# Patient Record
Sex: Male | Born: 1965 | Race: White | Hispanic: No | Marital: Married | Smoking: Current every day smoker
Health system: Southern US, Community
[De-identification: ages and names within clinical notes are randomized; demographics above are authoritative.]

## PROBLEM LIST (undated history)

## (undated) DIAGNOSIS — R059 Cough, unspecified: Secondary | ICD-10-CM

## (undated) DIAGNOSIS — R7303 Prediabetes: Principal | ICD-10-CM

## (undated) DIAGNOSIS — R05 Cough: Secondary | ICD-10-CM

## (undated) HISTORY — PX: NO PAST SURGERIES: SHX2092

## (undated) HISTORY — DX: Cough: R05

## (undated) HISTORY — DX: Prediabetes: R73.03

## (undated) HISTORY — DX: Cough, unspecified: R05.9

---

## 2012-03-07 ENCOUNTER — Encounter: Payer: Self-pay | Admitting: Internal Medicine

## 2012-05-08 ENCOUNTER — Ambulatory Visit (INDEPENDENT_AMBULATORY_CARE_PROVIDER_SITE_OTHER)
Admission: RE | Admit: 2012-05-08 | Discharge: 2012-05-08 | Disposition: A | Discharge status: Home / Self Care | Payer: 59 | Source: Ambulatory Visit | Attending: Internal Medicine | Admitting: Internal Medicine

## 2012-05-08 ENCOUNTER — Encounter: Payer: Self-pay | Admitting: Internal Medicine

## 2012-05-08 ENCOUNTER — Ambulatory Visit (INDEPENDENT_AMBULATORY_CARE_PROVIDER_SITE_OTHER): Payer: 59 | Admitting: Internal Medicine

## 2012-05-08 VITALS — BP 122/84 | HR 75 | Temp 97.8°F | Ht 69.5 in | Wt 194.0 lb

## 2012-05-08 DIAGNOSIS — R05 Cough: Secondary | ICD-10-CM

## 2012-05-08 NOTE — Patient Instructions (Addendum)
Dulera 2 puffs at night until sample gone Next visit in 4-5 weeks, please schedule (physical, fasting) --- Please get your x-ray at the other [facility]  office located at: [address] [place], across from [facility].  Please go to the basement, this is a walk-in facility, they are open from 8:30 to 5:30 PM. Phone number [phone].

## 2012-05-08 NOTE — Progress Notes (Signed)
  Subjective:    Patient ID: Kent Vargas, male    DOB: Oct 12, 1965, 47 y.o.   MRN: [id number]  HPI New patient , here to get established. He reports a five-month history of cough, on and off throughout the day, cough is dry. He smoked one pack a day since age 85, he quit 2 weeks ago through a smoking cessation program.  Past Medical History  Diagnosis Date  . Cough    Past Surgical History  Procedure Laterality Date  . No past surgeries     History   Social History  . Marital Status: Married    Spouse Name: N/A    Number of Children: 1  . Years of Education: N/A   Occupational History  . Tree surgeon    Social History Main Topics  . Smoking status: Former Games developer  . Smokeless tobacco: Never Used     Comment: 1 pp from age 55 to 68  . Alcohol Use: Yes     Comment: rare  . Drug Use: Not on file  . Sexually Active: Not on file   Other Topics Concern  . Not on file   Social History Narrative  . No narrative on file   Family History  Problem Relation Age of Onset  . Lung cancer Other     aunt  . Colon cancer Neg Hx   . Prostate cancer Neg Hx   . CAD Neg Hx   . Heart block Other     M and B have pacemakers   . Diabetes Neg Hx    Review of Systems Denies fever chills. No weight loss or chest pain. No wheezing although wife told him that some nights she hears some wheezing. He is very active ; no wheezing when physically activity. Denies GERD symptoms Denies any itchy eyes or itchy nose.    Objective:   Physical Exam General -- alert, well-developed, healthy-appearing, no apparent distress  Neck --no thyromegaly , no LADs or supraclavicular mass. HEENT-- TM R normal, L wax. Throat unremarkable  Lungs -- normal respiratory effort, no intercostal retractions, no accessory muscle use, and normal breath sounds.   Heart-- normal rate, regular rhythm, no murmur, and no gallop.   Extremities-- no pretibial edema bilaterally  Neurologic-- alert &  oriented X3 and strength normal in all extremities. Psych-- Cognition and judgment appear intact. Alert and cooperative with normal attention span and concentration.  not anxious appearing and not depressed appearing.      Assessment & Plan:  Cerumen impaction-- s.p lavage, TM looks normal Smoker-- praised for quitting

## 2012-05-08 NOTE — Assessment & Plan Note (Signed)
New patient, heavy smoker with 5 month history of dry cough, quit tobacco 2 weeks ago. Clinically, no postnasal dripping or GERD. Wheezing at night? Plan: Chest x-ray Sample of dulera 2 puffs qhs until sample gone RTC 4-5 weeks for CPX, will reassess cough then

## 2012-05-09 ENCOUNTER — Encounter: Payer: Self-pay | Admitting: Internal Medicine

## 2012-06-12 ENCOUNTER — Encounter: Payer: Self-pay | Admitting: Internal Medicine

## 2012-06-12 ENCOUNTER — Ambulatory Visit (INDEPENDENT_AMBULATORY_CARE_PROVIDER_SITE_OTHER): Payer: 59 | Admitting: Internal Medicine

## 2012-06-12 VITALS — BP 122/84 | HR 92 | Ht 68.0 in | Wt 189.0 lb

## 2012-06-12 DIAGNOSIS — Z0000 Encounter for general adult medical examination without abnormal findings: Secondary | ICD-10-CM

## 2012-06-12 DIAGNOSIS — Z23 Encounter for immunization: Secondary | ICD-10-CM

## 2012-06-12 DIAGNOSIS — R05 Cough: Secondary | ICD-10-CM

## 2012-06-12 LAB — COMPREHENSIVE METABOLIC PANEL
AST: 21 U/L (ref 0–37)
Albumin: 4 g/dL (ref 3.5–5.2)
Alkaline Phosphatase: 91 U/L (ref 39–117)
BUN: 12 mg/dL (ref 6–23)
Potassium: 4.2 mEq/L (ref 3.5–5.1)
Sodium: 137 mEq/L (ref 135–145)
Total Bilirubin: 0.5 mg/dL (ref 0.3–1.2)
Total Protein: 7.7 g/dL (ref 6.0–8.3)

## 2012-06-12 LAB — LDL CHOLESTEROL, DIRECT: Direct LDL: 157.8 mg/dL

## 2012-06-12 LAB — CBC WITH DIFFERENTIAL/PLATELET
Basophils Relative: 0.4 % (ref 0.0–3.0)
Eosinophils Absolute: 0.5 10*3/uL (ref 0.0–0.7)
Eosinophils Relative: 4.3 % (ref 0.0–5.0)
HCT: 42.7 % (ref 39.0–52.0)
Lymphs Abs: 2.2 10*3/uL (ref 0.7–4.0)
MCHC: 34.1 g/dL (ref 30.0–36.0)
MCV: 85 fl (ref 78.0–100.0)
Monocytes Absolute: 1.2 10*3/uL — ABNORMAL HIGH (ref 0.1–1.0)
Neutro Abs: 7.1 10*3/uL (ref 1.4–7.7)
Neutrophils Relative %: 64.6 % (ref 43.0–77.0)
RBC: 5.02 Mil/uL (ref 4.22–5.81)

## 2012-06-12 LAB — TSH: TSH: 1.4 u[IU]/mL (ref 0.35–5.50)

## 2012-06-12 LAB — LIPID PANEL
Total CHOL/HDL Ratio: 6
Triglycerides: 149 mg/dL (ref 0.0–149.0)

## 2012-06-12 MED ORDER — PREDNISONE 10 MG PO TABS: ORAL_TABLET | ORAL | Status: DC

## 2012-06-12 MED ORDER — DOXYCYCLINE HYCLATE 100 MG PO TABS: 100.0000 mg | ORAL_TABLET | Freq: Two times a day (BID) | ORAL | Status: DC

## 2012-06-12 MED ORDER — HYDROCODONE-HOMATROPINE 5-1.5 MG/5ML PO SYRP: 5.0000 mL | ORAL_SOLUTION | Freq: Every evening | ORAL | Status: DC | PRN

## 2012-06-12 NOTE — Patient Instructions (Signed)
For cough, take Mucinex DM twice a day as needed Claritin OTC 10 mg one every night for 2 or 3 weeks  If you have severe cough at night, take hydrocodone, Will make you drowsy. Prednisone as prescribed for few days Take the antibiotic as prescribed Doxycycline, for 10 days Call if no better in 2 weeks for a referral Call anytime if the symptoms are severe

## 2012-06-12 NOTE — Assessment & Plan Note (Signed)
Since the last time he was here, a chest x-ray was unremarkable, he tried dulera, Unclear if it helped. Atypical Bronchial infection? Sinusitis? Allergies? Plan: Doxycycline Prednisone for a few days Cough suppression with hydrocodone pt will call in 2 weeks if cough persists, will refer to pulmonary.

## 2012-06-12 NOTE — Progress Notes (Signed)
  Subjective:    Patient ID: Kent Vargas, male    DOB: 1965/02/21, 47 y.o.   MRN: [id number]  HPI CPX  Past Medical History  Diagnosis Date  . Cough    Past Surgical History  Procedure Laterality Date  . No past surgeries     History   Social History  . Marital Status: Married    Spouse Name: Matas Burrows    Number of Children: 1  . Years of Education: N/A   Occupational History  . Tree surgeon   .     Social History Main Topics  . Smoking status: Former Games developer  . Smokeless tobacco: Never Used     Comment: 1 ppd from age 34 to 45  . Alcohol Use: Yes     Comment: rare  . Drug Use: Not on file  . Sexually Active: Not on file   Other Topics Concern  . Not on file   Social History Narrative   Diet: healthy on-off   Exercise: little recently, trying to do more, very active at work   Family History  Problem Relation Age of Onset  . Lung cancer Other     aunt  . Colon cancer Neg Hx   . Prostate cancer Neg Hx   . CAD Neg Hx   . Heart block Other     M and B have pacemakers   . Diabetes Neg Hx      Review of Systems Recently seen with cough continue with symptoms, persistent cough, now w/ yellow discharge, unclear to the patient if mucus is from the sinuses or the chest. Denies chest pain. No nausea, vomiting, diarrhea, no GERD symptoms. No dysuria, gross hematuria or difficulty urinating.     Objective:   Physical Exam  Musculoskeletal:       Arms:  BP 122/84  Pulse 92  Ht 5\' 8"  (1.727 m)  Wt 189 lb (85.73 kg)  BMI 28.74 kg/m2  SpO2 97%  General -- alert, well-developed, NAD   Neck --no thyromegaly , normal carotid pulse, no LADs HEENT -- TMs normal, throat w/o redness, face symmetric and not tender to palpation, nose congested, no d/c noted  Lungs -- normal respiratory effort, no intercostal retractions, no accessory muscle use, and normal breath sounds.   Heart-- normal rate, regular rhythm, no murmur, and no gallop.     Abdomen--soft, non-tender, no distention, no masses, no HSM, no guarding, and no rigidity.   Extremities-- no pretibial edema bilaterally  Neurologic-- alert & oriented X3 and strength normal in all extremities. Psych-- Cognition and judgment appear intact. Alert and cooperative with normal attention span and concentration.  not anxious appearing and not depressed appearing.       Assessment & Plan:  Subcutaneous mass: Patient noted this 2 years ago, it stopped growing about a year ago, asymptomatic. We discussed possible surgical referral.

## 2012-06-12 NOTE — Assessment & Plan Note (Signed)
Tdap today Never had a colonoscopy  discussed diet and exercise Labs

## 2012-06-13 ENCOUNTER — Encounter: Payer: Self-pay | Admitting: Internal Medicine

## 2012-06-18 ENCOUNTER — Telehealth: Payer: Self-pay | Admitting: Internal Medicine

## 2012-06-18 DIAGNOSIS — R222 Localized swelling, mass and lump, trunk: Secondary | ICD-10-CM

## 2012-06-18 NOTE — Telephone Encounter (Signed)
Labs; Has a few minor abnormalities (CBC) ---> that needs to be rechecked in one year. LDL 157.8, goal is less than 160, ideally I would like to see < 130. Discussed w/ patient. Also we agreed on surgery referral for SQ mass

## 2012-06-27 ENCOUNTER — Ambulatory Visit (INDEPENDENT_AMBULATORY_CARE_PROVIDER_SITE_OTHER): Payer: Private Health Insurance - Indemnity | Admitting: General Surgery

## 2012-06-27 ENCOUNTER — Encounter (INDEPENDENT_AMBULATORY_CARE_PROVIDER_SITE_OTHER): Payer: Self-pay | Admitting: General Surgery

## 2012-06-27 VITALS — BP 140/74 | HR 84 | Temp 97.3°F | Resp 16 | Ht 68.0 in | Wt 195.0 lb

## 2012-06-27 DIAGNOSIS — D171 Benign lipomatous neoplasm of skin and subcutaneous tissue of trunk: Secondary | ICD-10-CM

## 2012-06-27 DIAGNOSIS — D1779 Benign lipomatous neoplasm of other sites: Secondary | ICD-10-CM

## 2012-06-27 NOTE — Progress Notes (Signed)
Subjective:     Patient ID: Kent Vargas, male   DOB: 05-25-1965, 47 y.o.   MRN: [id number]  HPI We are asked to see the patient in consultation by Dr. Drue Novel to evaluate him for a mass on his back. The patient is a 47 year old white male who fell about a year ago and broke some ribs on his left side. Shortly after this he noticed a bump on his back. He denies any pain. He mostly notices it when he rolls over in bed. He does not feel that it has gotten much larger since he first noticed it.  Review of Systems  Constitutional: Negative.   HENT: Negative.   Eyes: Negative.   Respiratory: Negative.   Cardiovascular: Negative.   Gastrointestinal: Negative.   Endocrine: Negative.   Genitourinary: Negative.   Musculoskeletal: Negative.   Skin: Negative.   Allergic/Immunologic: Negative.   Neurological: Negative.   Hematological: Negative.   Psychiatric/Behavioral: Negative.        Objective:   Physical Exam  Constitutional: He is oriented to person, place, and time. He appears well-developed and well-nourished.  HENT:  Head: Normocephalic and atraumatic.  Eyes: Conjunctivae and EOM are normal. Pupils are equal, round, and reactive to light.  Neck: Normal range of motion. Neck supple.  Cardiovascular: Normal rate, regular rhythm and normal heart sounds.   Pulmonary/Chest: Effort normal and breath sounds normal.  There is a fatty feeling mass on the left lower rib cage posteriorly that is mobile and measures about 5 cm in diameter  Abdominal: Soft. Bowel sounds are normal.  Musculoskeletal: Normal range of motion.  Neurological: He is alert and oriented to person, place, and time.  Skin: Skin is warm and dry.  Psychiatric: He has a normal mood and affect. His behavior is normal.       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 I have offered to remove this for him. Since it does not bother him he would like to watch it  and see what happens.     Plan:     My plan will be to see him back in 6 months at which time if this mass begins to bother him more seems to be enlarging that I think it should be removed. He agrees to call us if anything changes in the meantime

## 2012-06-27 NOTE — Patient Instructions (Signed)
Call if it changes or begins to bother you

## 2012-11-15 ENCOUNTER — Telehealth: Payer: Self-pay | Admitting: *Deleted

## 2012-11-15 NOTE — Telephone Encounter (Signed)
Medical Evaluation Form for [facility] [employer] completed and placed in provider's folder for signature.

## 2012-11-18 ENCOUNTER — Telehealth: Payer: Self-pay | Admitting: Internal Medicine

## 2012-11-18 NOTE — Telephone Encounter (Signed)
Advise patient,  he was seen twice this year with cough,  doing better?, cough resolved? I need to know before I fill his paper work. Let me know

## 2012-11-19 DIAGNOSIS — Z0279 Encounter for issue of other medical certificate: Secondary | ICD-10-CM

## 2012-11-19 NOTE — Telephone Encounter (Signed)
Spoke to pt, states that he is currently doing fine, no complaints. DJR

## 2012-11-19 NOTE — Telephone Encounter (Signed)
thx

## 2012-12-19 ENCOUNTER — Ambulatory Visit (INDEPENDENT_AMBULATORY_CARE_PROVIDER_SITE_OTHER): Payer: Private Health Insurance - Indemnity | Admitting: General Surgery

## 2013-01-01 ENCOUNTER — Encounter (INDEPENDENT_AMBULATORY_CARE_PROVIDER_SITE_OTHER): Payer: Self-pay | Admitting: General Surgery

## 2013-01-01 ENCOUNTER — Ambulatory Visit (INDEPENDENT_AMBULATORY_CARE_PROVIDER_SITE_OTHER): Payer: Private Health Insurance - Indemnity | Admitting: General Surgery

## 2013-01-01 ENCOUNTER — Encounter (INDEPENDENT_AMBULATORY_CARE_PROVIDER_SITE_OTHER): Payer: Self-pay

## 2013-01-01 VITALS — BP 126/82 | HR 72 | Temp 97.7°F | Resp 14 | Ht 67.0 in | Wt 200.0 lb

## 2013-01-01 DIAGNOSIS — D171 Benign lipomatous neoplasm of skin and subcutaneous tissue of trunk: Secondary | ICD-10-CM

## 2013-01-01 DIAGNOSIS — D1779 Benign lipomatous neoplasm of other sites: Secondary | ICD-10-CM

## 2013-01-01 NOTE — Progress Notes (Signed)
Subjective:     Patient ID: Kent Vargas, male   DOB: 05-Dec-1965, 47 y.o.   MRN: [id number]  HPI The patient has a lipoma on his left posterior chest wall. We evaluated him initially about 6 months ago. He returns today for followup. In that time he has had no problems. He denies any pain. This lipoma does not limit his activity in any way. He does not feel it has gotten any larger.  Review of Systems  Constitutional: Negative.   HENT: Negative.   Eyes: Negative.   Respiratory: Negative.   Cardiovascular: Negative.   Gastrointestinal: Negative.   Endocrine: Negative.   Genitourinary: Negative.   Musculoskeletal: Negative.   Skin: Negative.   Allergic/Immunologic: Negative.   Neurological: Negative.   Hematological: Negative.   Psychiatric/Behavioral: Negative.        Objective:   Physical Exam  Constitutional: He is oriented to person, place, and time. He appears well-developed and well-nourished.  HENT:  Head: Normocephalic and atraumatic.  Eyes: Conjunctivae and EOM are normal. Pupils are equal, round, and reactive to light.  Neck: Normal range of motion. Neck supple.  Cardiovascular: Normal rate, regular rhythm and normal heart sounds.   Pulmonary/Chest: Effort normal and breath sounds normal.  There is an approximately 5 cm fatty lipoma of the left posterior chest wall which is unchanged  Abdominal: Soft. Bowel sounds are normal.  Musculoskeletal: Normal range of motion.  Neurological: He is alert and oriented to person, place, and time.  Skin: Skin is warm and dry.  Psychiatric: He has a normal mood and affect. His behavior is normal.       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 At this point the options would be to remove it or leave it alone     Plan:     At this point he does not want to have any surgery. He agrees to call us if it changes or begins to bother him at all.

## 2013-01-01 NOTE — Patient Instructions (Signed)
Call if it changes or bothers you

## 2013-10-23 ENCOUNTER — Telehealth: Payer: Self-pay

## 2013-10-23 NOTE — Telephone Encounter (Signed)
DSS Paperwork brought in by Pts wife, form completed by Dr. Larose Kells, copies made and original given back to Pts wife. Form placed in scanning folder to be scanned.

## 2014-03-16 IMAGING — CR DG CHEST 2V
2 series · 2 of 2 positions shown · non-contrast
Comparison: None.

CLINICAL DATA: Cough

CHEST - 2 VIEW

[view not recorded (1 of 2)]
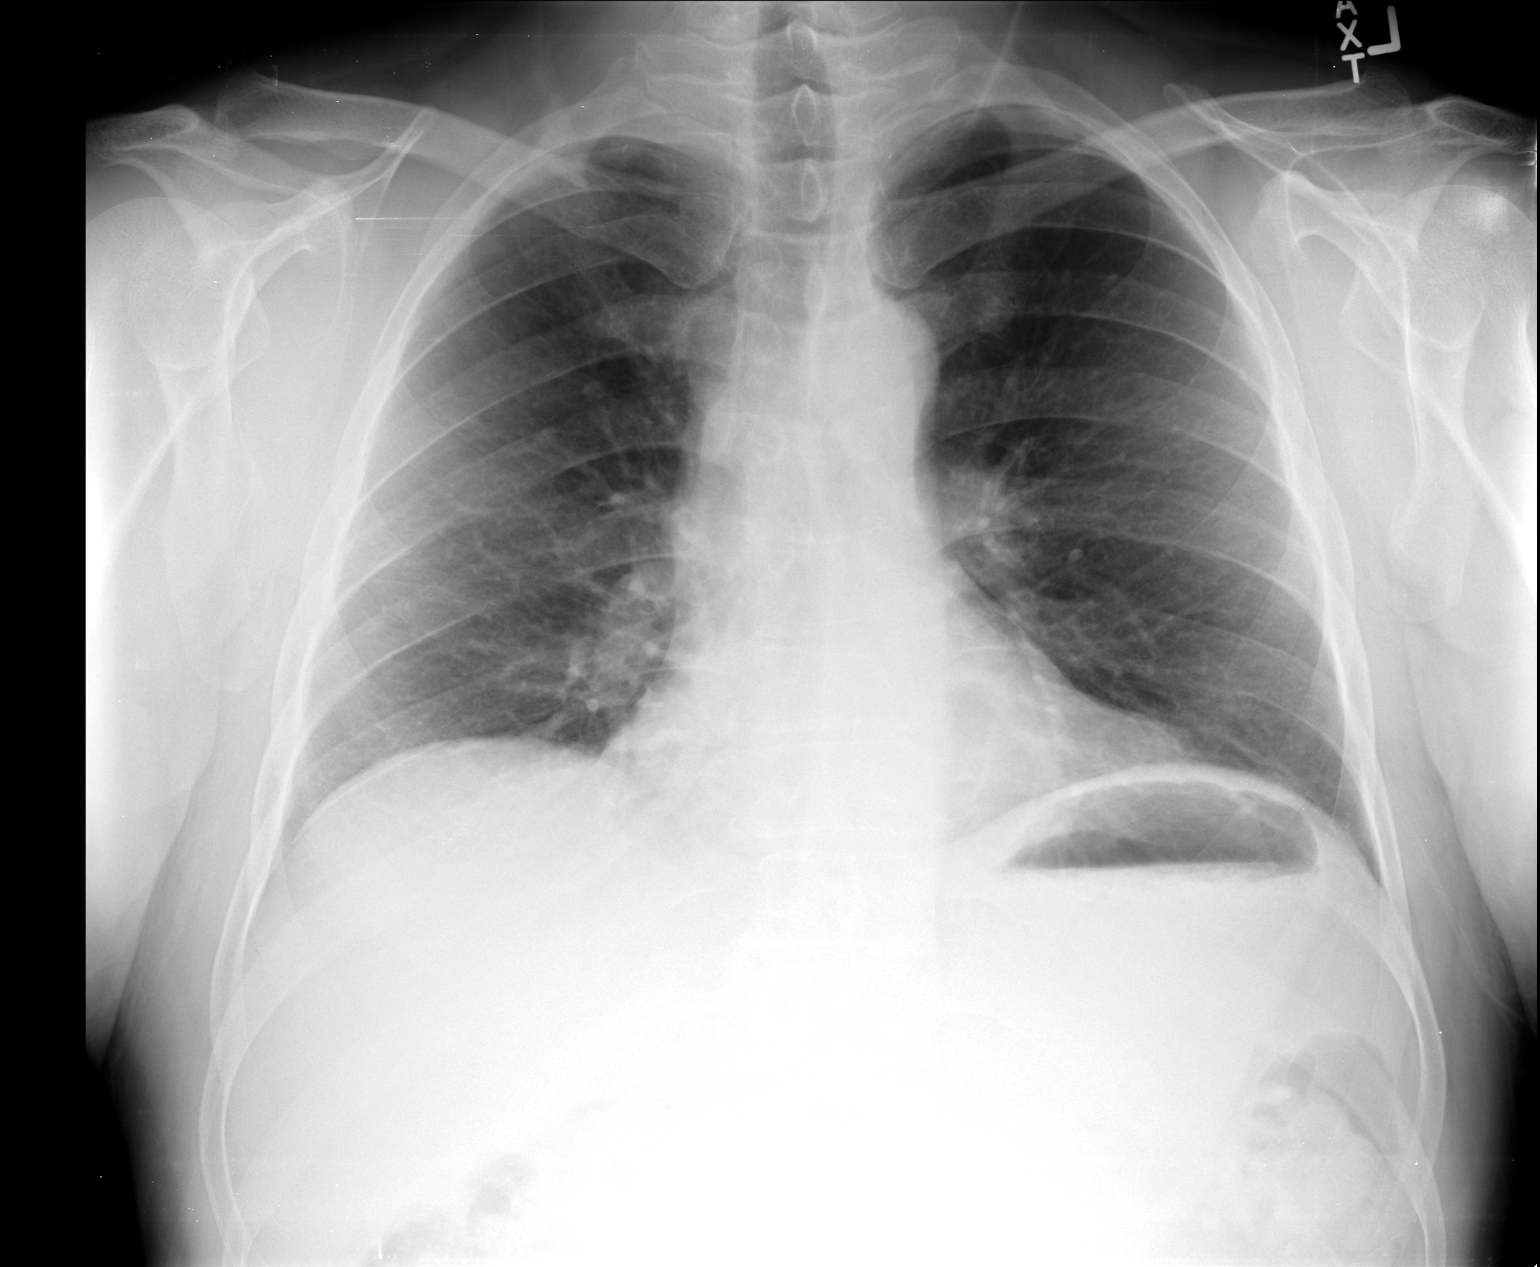

[view not recorded (2 of 2)]
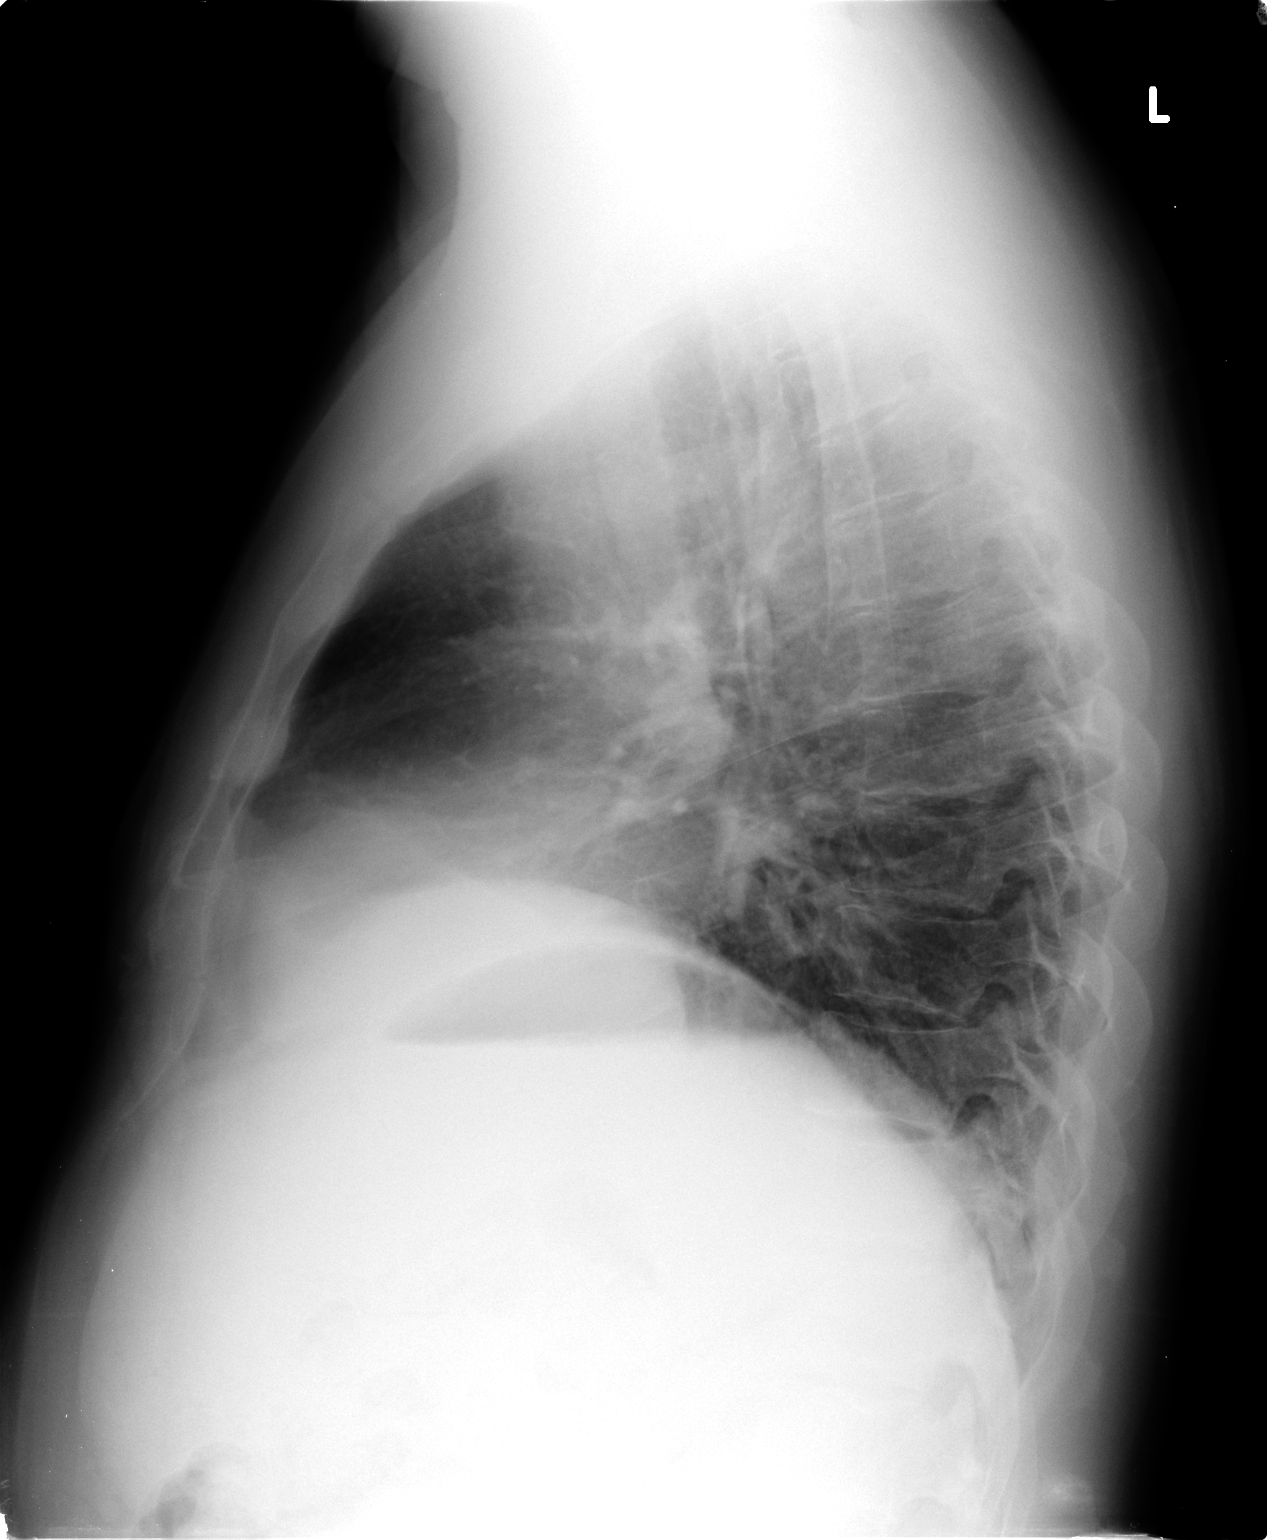

[2 of 2 positions shown; findings below may reference images not displayed]

FINDINGS: Lungs are essentially clear.  No focal consolidation.  No
pleural effusion or pneumothorax.

The heart is normal in size.  Mild fullness of the left pulmonary
hilum is likely vascular.

Degenerative changes of the visualized thoracolumbar spine.
IMPRESSION: No evidence of acute cardiopulmonary disease.

## 2014-10-17 ENCOUNTER — Telehealth: Payer: Self-pay | Admitting: Internal Medicine

## 2014-10-17 NOTE — Telephone Encounter (Signed)
Okay to put 2-15 minute appts together.  

## 2014-10-17 NOTE — Telephone Encounter (Addendum)
Patient scheduled for 11/05/2014 at 2pm.

## 2014-10-17 NOTE — Telephone Encounter (Signed)
Relation to pt: self Call back number: [phone]    Reason for call:  Patient states he needs CPE before 11/30/14 due to foster care recertification. There are no physical appointment until November can patient be scheduled with another provider. Please advise.

## 2014-11-05 ENCOUNTER — Encounter: Payer: 59 | Admitting: Internal Medicine

## 2014-11-28 ENCOUNTER — Telehealth: Payer: Self-pay | Admitting: Internal Medicine

## 2014-11-28 NOTE — Telephone Encounter (Signed)
That is fine, or you can use 11:30 slot.

## 2014-11-28 NOTE — Telephone Encounter (Signed)
Caller name: Santiago Glad Relation to pt: wife Call back number: [phone]   Reason for call: Pt's wife would like to know if  Art[facility] can have earlier appt for Cpe since pt has to fill out document as a foster parent and he is needing it before the end of November. Is it ok to put him in schedule on November 22,2016 at 2:15 with two slots together (30 Min)? Please advise.

## 2014-12-02 NOTE — Telephone Encounter (Signed)
Ok scheduled pt for Nov 22, 2:15. Thanks

## 2014-12-22 ENCOUNTER — Telehealth: Payer: Self-pay

## 2014-12-22 NOTE — Telephone Encounter (Signed)
Pre Visit call completed. 

## 2014-12-23 ENCOUNTER — Encounter: Payer: Self-pay | Admitting: Internal Medicine

## 2014-12-23 ENCOUNTER — Ambulatory Visit (INDEPENDENT_AMBULATORY_CARE_PROVIDER_SITE_OTHER): Payer: 59 | Admitting: Internal Medicine

## 2014-12-23 VITALS — BP 118/76 | HR 89 | Temp 98.1°F | Ht 69.0 in | Wt 194.5 lb

## 2014-12-23 DIAGNOSIS — Z0000 Encounter for general adult medical examination without abnormal findings: Secondary | ICD-10-CM

## 2014-12-23 DIAGNOSIS — Z1211 Encounter for screening for malignant neoplasm of colon: Secondary | ICD-10-CM

## 2014-12-23 LAB — COMPREHENSIVE METABOLIC PANEL
ALBUMIN: 4.1 g/dL (ref 3.5–5.2)
ALK PHOS: 82 U/L (ref 39–117)
ALT: 26 U/L (ref 0–53)
AST: 16 U/L (ref 0–37)
BILIRUBIN TOTAL: 0.4 mg/dL (ref 0.2–1.2)
BUN: 15 mg/dL (ref 6–23)
CALCIUM: 9 mg/dL (ref 8.4–10.5)
CO2: 25 mEq/L (ref 19–32)
Chloride: 104 mEq/L (ref 96–112)
Creatinine, Ser: 1.05 mg/dL (ref 0.40–1.50)
GFR: 79.26 mL/min (ref >60.00)
Glucose, Bld: 70 mg/dL (ref 70–99)
Potassium: 4.3 mEq/L (ref 3.5–5.1)
SODIUM: 138 meq/L (ref 135–145)
TOTAL PROTEIN: 7 g/dL (ref 6.0–8.3)

## 2014-12-23 LAB — CBC WITH DIFFERENTIAL/PLATELET
BASOS ABS: 0 10*3/uL (ref 0.0–0.1)
Basophils Relative: 0.2 % (ref 0.0–3.0)
Eosinophils Absolute: 0.5 10*3/uL (ref 0.0–0.7)
Eosinophils Relative: 4.6 % (ref 0.0–5.0)
HCT: 42.7 % (ref 39.0–52.0)
Hemoglobin: 13.8 g/dL (ref 13.0–17.0)
LYMPHS ABS: 2.7 10*3/uL (ref 0.7–4.0)
LYMPHS PCT: 27.5 % (ref 12.0–46.0)
MCHC: 32.2 g/dL (ref 30.0–36.0)
MCV: 82.4 fl (ref 78.0–100.0)
MONOS PCT: 11.7 % (ref 3.0–12.0)
Monocytes Absolute: 1.2 10*3/uL — ABNORMAL HIGH (ref 0.1–1.0)
NEUTROS PCT: 56 % (ref 43.0–77.0)
Neutro Abs: 5.6 10*3/uL (ref 1.4–7.7)
Platelets: 369 10*3/uL (ref 150.0–400.0)
RBC: 5.19 Mil/uL (ref 4.22–5.81)
RDW: 16.2 % — ABNORMAL HIGH (ref 11.5–15.5)
WBC: 9.9 10*3/uL (ref 4.0–10.5)

## 2014-12-23 LAB — CHOLESTEROL, TOTAL: CHOLESTEROL: 211 mg/dL — AB (ref 0–200)

## 2014-12-23 LAB — HEMOGLOBIN A1C: Hgb A1c MFr Bld: 6.3 % (ref 4.6–6.5)

## 2014-12-23 LAB — PSA: PSA: 0.62 ng/mL (ref 0.10–4.00)

## 2014-12-23 NOTE — Assessment & Plan Note (Addendum)
Tdap 2014 Flu shot, pnm shot : Benefits discussed, declined.  CCS:  Never had a colonoscopy. Cscope vs cologuard vs Hemoccults  >>> Discussed, elected iFOB Prostate cancer screening: DRE normal, check a PSA Wife reported that he may be depressed, patient strongly denies. PHQ 9 scored 4 >>  negative   Diet and exercise Labs: HIV, CMP, total cholesterol (not fasting), CBC, A1c, PSA EKG normal sinus Tobacco abuse: Risks including cancer, CAD strokes discussed. Encouraged to call me when ready to quit. He does see a dentist every 6 months  RTC one year

## 2014-12-23 NOTE — Progress Notes (Signed)
Pre visit review using our clinic review tool, if applicable. No additional management support is needed unless otherwise documented below in the visit note. 

## 2014-12-23 NOTE — Progress Notes (Signed)
Subjective:    Patient ID: Kent Vargas, male    DOB: 08/20/1965, 49 y.o.   MRN: [id number]  DOS:  12/23/2014 Type of visit - description : CPX, no major concerns Interval history: Continue smoking    Review of Systems  Constitutional: No fever. No chills. No unexplained wt changes. No unusual sweats  HEENT: No dental problems, no ear discharge, no facial swelling, no voice changes. No eye discharge, no eye  redness , no  intolerance to light   Respiratory: No wheezing , no  difficulty breathing. Continue with a dry cough, most days. No shortness of breath with normal activities or work.  . Cardiovascular: No CP, no leg swelling , no  Palpitations  GI: no nausea, no vomiting, no diarrhea , no  abdominal pain.  No blood in the stools. No dysphagia, no odynophagia    Endocrine: No polyphagia, no polyuria , no polydipsia  GU: No dysuria, gross hematuria, difficulty urinating. No urinary urgency, no frequency.  Musculoskeletal: No joint swellings or unusual aches or pains  Skin: No change in the color of the skin, palor , no  Rash  Allergic, immunologic: No environmental allergies , no  food allergies  Neurological: No dizziness no  syncope. No headaches. No diplopia, no slurred, no slurred speech, no motor deficits, no facial  Numbness  Hematological: No enlarged lymph nodes, no easy bruising , no unusual bleedings  Psychiatry: No suicidal ideas, no hallucinations, no beavior problems, no confusion.  No unusual/severe anxiety, no depression   Past Medical History  Diagnosis Date  . Cough     Past Surgical History  Procedure Laterality Date  . No past surgeries      Social History   Social History  . Marital Status: Married    Spouse Name: Decatur Segrest  . Number of Children: 1  . Years of Education: N/A   Occupational History  . Orthoptist   .     Social History Main Topics  . Smoking status: Current Every Day Smoker  . Smokeless  tobacco: Never Used     Comment: 1 ppd   . Alcohol Use: 0.0 oz/week    0 Standard drinks or equivalent per week     Comment: rare  . Drug Use: Not on file  . Sexual Activity: Not on file   Other Topics Concern  . Not on file   Social History Narrative         Family History  Problem Relation Age of Onset  . Lung cancer Other     aunt  . Colon cancer Neg Hx   . Prostate cancer Neg Hx   . CAD Neg Hx   . Diabetes Neg Hx   . Heart block Other     M and B have pacemakers        Medication List    Notice  As of 12/23/2014  8:23 PM   You have not been prescribed any medications.         Objective:   Physical Exam BP 118/76 mmHg  Pulse 89  Temp(Src) 98.1 F (36.7 C) (Oral)  Ht 5\' 9"  (1.753 m)  Wt 194 lb 8 oz (88.225 kg)  BMI 28.71 kg/m2  SpO2 99% General:   Well developed, well nourished . NAD.  Neck:   No  thyromegaly , normal carotid pulse HEENT:  Normocephalic . Face symmetric, atraumatic Lungs:  CTA B Normal respiratory effort, no intercostal retractions, no accessory muscle use.  Heart: RRR,  no murmur.  No pretibial edema bilaterally  Abdomen:  Not distended, soft, non-tender. No rebound or rigidity Rectal:  External abnormalities: none. Normal sphincter tone. No rectal masses or tenderness.  Stool brown  Prostate: Prostate gland firm and smooth, no enlargement, nodularity, tenderness, mass, asymmetry or induration.  Skin: Exposed areas without rash. Not pale. Not jaundice Neurologic:  alert & oriented X3.  Speech normal, gait appropriate for age and unassisted Strength symmetric and appropriate for age.  Psych: Cognition and judgment appear intact.  Cooperative with normal attention span and concentration.  Behavior appropriate. No anxious or depressed appearing.    Assessment & Plan:

## 2014-12-23 NOTE — Patient Instructions (Addendum)
Get your blood work before you leave     Next visit  for a physical exam in 1 year, fasting   Please schedule an appointment at the front desk   Steps to Quit Smoking  Smoking tobacco can be harmful to your health and can affect almost every organ in your body. Smoking puts you, and those around you, at risk for developing many serious chronic diseases. Quitting smoking is difficult, but it is one of the best things that you can do for your health. It is never too late to quit. WHAT ARE THE BENEFITS OF QUITTING SMOKING? When you quit smoking, you lower your risk of developing serious diseases and conditions, such as:  Lung cancer or lung disease, such as COPD.  Heart disease.  Stroke.  Heart attack.  Infertility.  Osteoporosis and bone fractures. Additionally, symptoms such as coughing, wheezing, and shortness of breath may get better when you quit. You may also find that you get sick less often because your body is stronger at fighting off colds and infections. If you are pregnant, quitting smoking can help to reduce your chances of having a baby of low birth weight. HOW DO I GET READY TO QUIT? When you decide to quit smoking, create a plan to make sure that you are successful. Before you quit:  Pick a date to quit. Set a date within the next two weeks to give you time to prepare.  Write down the reasons why you are quitting. Keep this list in places where you will see it often, such as on your bathroom mirror or in your car or wallet.  Identify the people, places, things, and activities that make you want to smoke (triggers) and avoid them. Make sure to take these actions:  Throw away all cigarettes at home, at work, and in your car.  Throw away smoking accessories, such as Scientist, research (medical).  Clean your car and make sure to empty the ashtray.  Clean your home, including curtains and carpets.  Tell your family, friends, and coworkers that you are quitting. Support from  your loved ones can make quitting easier.  Talk with your health care provider about your options for quitting smoking.  Find out what treatment options are covered by your health insurance. WHAT STRATEGIES CAN I USE TO QUIT SMOKING?  Talk with your healthcare provider about different strategies to quit smoking. Some strategies include:  Quitting smoking altogether instead of gradually lessening how much you smoke over a period of time. Research shows that quitting "cold [place]" is more successful than gradually quitting.  Attending in-person counseling to help you build problem-solving skills. You are more likely to have success in quitting if you attend several counseling sessions. Even short sessions of 10 minutes can be effective.  Finding resources and support systems that can help you to quit smoking and remain smoke-free after you quit. These resources are most helpful when you use them often. They can include:  Online chats with a Social worker.  Telephone quitlines.  Printed Furniture conservator/restorer.  Support groups or group counseling.  Text messaging programs.  Mobile phone applications.  Taking medicines to help you quit smoking. (If you are pregnant or breastfeeding, talk with your health care provider first.) Some medicines contain nicotine and some do not. Both types of medicines help with cravings, but the medicines that include nicotine help to relieve withdrawal symptoms. Your health care provider may recommend:  Nicotine patches, gum, or lozenges.  Nicotine inhalers or sprays.  Non-nicotine medicine that is taken by mouth. Talk with your health care provider about combining strategies, such as taking medicines while you are also receiving in-person counseling. Using these two strategies together makes you more likely to succeed in quitting than if you used either strategy on its own. If you are pregnant or breastfeeding, talk with your health care provider about finding  counseling or other support strategies to quit smoking. Do not take medicine to help you quit smoking unless told to do so by your health care provider. WHAT THINGS CAN I DO TO MAKE IT EASIER TO QUIT? Quitting smoking might feel overwhelming at first, but there is a lot that you can do to make it easier. Take these important actions:  Reach out to your family and friends and ask that they support and encourage you during this time. Call telephone quitlines, reach out to support groups, or work with a counselor for support.  Ask people who smoke to avoid smoking around you.  Avoid places that trigger you to smoke, such as bars, parties, or smoke-break areas at work.  Spend time around people who do not smoke.  Lessen stress in your life, because stress can be a smoking trigger for some people. To lessen stress, try:  Exercising regularly.  Deep-breathing exercises.  Yoga.  Meditating.  Performing a body scan. This involves closing your eyes, scanning your body from head to toe, and noticing which parts of your body are particularly tense. Purposefully relax the muscles in those areas.  Download or purchase mobile phone or tablet apps (applications) that can help you stick to your quit plan by providing reminders, tips, and encouragement. There are many free apps, such as QuitGuide from the [employer] Office manager for Disease Control and Prevention). You can find other support for quitting smoking (smoking cessation) through smokefree.gov and other websites. HOW WILL I FEEL WHEN I QUIT SMOKING? Within the first 24 hours of quitting smoking, you may start to feel some withdrawal symptoms. These symptoms are usually most noticeable 2-3 days after quitting, but they usually do not last beyond 2-3 weeks. Changes or symptoms that you might experience include:  Mood swings.  Restlessness, anxiety, or irritation.  Difficulty concentrating.  Dizziness.  Strong cravings for sugary foods in addition to  nicotine.  Mild weight gain.  Constipation.  Nausea.  Coughing or a sore throat.  Changes in how your medicines work in your body.  A depressed mood.  Difficulty sleeping (insomnia). After the first 2-3 weeks of quitting, you may start to notice more positive results, such as:  Improved sense of smell and taste.  Decreased coughing and sore throat.  Slower heart rate.  Lower blood pressure.  Clearer skin.  The ability to breathe more easily.  Fewer sick days. Quitting smoking is very challenging for most people. Do not get discouraged if you are not successful the first time. Some people need to make many attempts to quit before they achieve long-term success. Do your best to stick to your quit plan, and talk with your health care provider if you have any questions or concerns.   This information is not intended to replace advice given to you by your health care provider. Make sure you discuss any questions you have with your health care provider.   Document Released: 01/11/2001 Document Revised: 06/03/2014 Document Reviewed: 06/03/2014 Elsevier Interactive Patient Education [employer].

## 2014-12-24 LAB — HIV ANTIBODY (ROUTINE TESTING W REFLEX): HIV 1&2 Ab, 4th Generation: NONREACTIVE

## 2014-12-30 ENCOUNTER — Telehealth: Payer: Self-pay | Admitting: Internal Medicine

## 2014-12-30 NOTE — Telephone Encounter (Signed)
Pt dropped off paper work (document ok to be foster parent)

## 2014-12-31 NOTE — Telephone Encounter (Signed)
Forms completed and placed up front for pick up. Informed pt. Copy sent for scanning. JG//CMA

## 2015-02-04 ENCOUNTER — Encounter: Payer: 59 | Admitting: Internal Medicine

## 2015-12-23 ENCOUNTER — Ambulatory Visit (INDEPENDENT_AMBULATORY_CARE_PROVIDER_SITE_OTHER): Payer: Managed Care, Other (non HMO) | Admitting: Internal Medicine

## 2015-12-23 ENCOUNTER — Encounter: Payer: Self-pay | Admitting: Internal Medicine

## 2015-12-23 VITALS — BP 122/70 | HR 78 | Temp 97.9°F | Resp 12 | Ht 69.0 in | Wt 195.2 lb

## 2015-12-23 DIAGNOSIS — Z23 Encounter for immunization: Secondary | ICD-10-CM

## 2015-12-23 DIAGNOSIS — R739 Hyperglycemia, unspecified: Secondary | ICD-10-CM | POA: Diagnosis not present

## 2015-12-23 DIAGNOSIS — Z0000 Encounter for general adult medical examination without abnormal findings: Secondary | ICD-10-CM

## 2015-12-23 DIAGNOSIS — Z1211 Encounter for screening for malignant neoplasm of colon: Secondary | ICD-10-CM

## 2015-12-23 LAB — BASIC METABOLIC PANEL
BUN: 9 mg/dL (ref 6–23)
CHLORIDE: 103 meq/L (ref 96–112)
CO2: 26 mEq/L (ref 19–32)
CREATININE: 1.01 mg/dL (ref 0.40–1.50)
Calcium: 9.2 mg/dL (ref 8.4–10.5)
GFR: 82.56 mL/min (ref >60.00)
GLUCOSE: 94 mg/dL (ref 70–99)
Potassium: 4.2 mEq/L (ref 3.5–5.1)
Sodium: 137 mEq/L (ref 135–145)

## 2015-12-23 LAB — LIPID PANEL
CHOLESTEROL: 213 mg/dL — AB (ref 0–200)
HDL: 34.3 mg/dL — ABNORMAL LOW (ref >39.00)
LDL CALC: 145 mg/dL — AB (ref 0–99)
NONHDL: 178.65
Total CHOL/HDL Ratio: 6
Triglycerides: 169 mg/dL — ABNORMAL HIGH (ref 0.0–149.0)
VLDL: 33.8 mg/dL (ref 0.0–40.0)

## 2015-12-23 LAB — TSH: TSH: 0.48 u[IU]/mL (ref 0.35–4.50)

## 2015-12-23 LAB — HEMOGLOBIN A1C: HEMOGLOBIN A1C: 6.1 % (ref 4.6–6.5)

## 2015-12-23 NOTE — Assessment & Plan Note (Addendum)
Tdap 2014; Flu shot today  CCS:  Never had a colonoscopy. Cscope vs cologuard vs Hemoccults  >>> he agreed to be referred to GI Prostate cancer screening: DRE and PSA normal  2016  Smoker: Counseled about quitting, sees  dentist yearly. He has cough most days but no wheezing or DOE. Consider PFTs at some point.  Labs  -BMP, A1c, FLP, TSH Diet and exercise discussed

## 2015-12-23 NOTE — Progress Notes (Signed)
Subjective:    Patient ID: Kent Vargas, male    DOB: 1965/05/16, 50 y.o.   MRN: [id number]  DOS:  12/23/2015 Type of visit - description : cpx Interval history:No major concerns, feeling well    Review of Systems Constitutional: No fever. No chills. No unexplained wt changes. No unusual sweats  HEENT: No dental problems, no ear discharge, no facial swelling, no voice changes. No eye discharge, no eye  redness , no  intolerance to light   Respiratory:  When asked, admits to cough most mornings, dry. Wife has told him that he wheezes sometimes at night. No  difficulty breathing.    Cardiovascular: No CP, no leg swelling , no  Palpitations  GI: no nausea, no vomiting, no diarrhea , no  abdominal pain.  No blood in the stools. No dysphagia, no odynophagia    Endocrine: No polyphagia, no polyuria , no polydipsia  GU: No dysuria, gross hematuria, difficulty urinating. No urinary urgency, no frequency.  Musculoskeletal: No joint swellings or unusual aches or pains  Skin: No change in the color of the skin, palor , no  Rash  Allergic, immunologic: No environmental allergies , no  food allergies  Neurological: No dizziness no  syncope. No headaches. No diplopia, no slurred, no slurred speech, no motor deficits, no facial  Numbness  Hematological: No enlarged lymph nodes, no easy bruising , no unusual bleedings  Psychiatry: No suicidal ideas, no hallucinations, no beavior problems, no confusion.  No unusual/severe anxiety, no depression   Past Medical History:  Diagnosis Date  . Cough     Past Surgical History:  Procedure Laterality Date  . NO PAST SURGERIES      Social History   Social History  . Marital status: Married    Spouse name: Bostin Vitiello  . Number of children: 1  . Years of education: N/A   Occupational History  . Orthoptist   .  [employer]   Social History Main Topics  . Smoking status: Current Every Day Smoker  . Smokeless  tobacco: Never Used     Comment: 1 ppd   . Alcohol use 0.0 oz/week     Comment: rare  . Drug use: Unknown  . Sexual activity: Not on file   Other Topics Concern  . Not on file   Social History Narrative         Family History  Problem Relation Age of Onset  . Colon polyps Father   . Lung cancer Other     aunt  . Heart block Other     M and B have pacemakers   . Colon cancer Neg Hx   . Prostate cancer Neg Hx   . CAD Neg Hx   . Diabetes Neg Hx        Medication List    as of 12/23/2015 11:59 PM   You have not been prescribed any medications.        Objective:   Physical Exam BP 122/70 (BP Location: Left Arm, Patient Position: Sitting, Cuff Size: Normal)   Pulse 78   Temp 97.9 F (36.6 C) (Oral)   Resp 12   Ht 5\' 9"  (1.753 m)   Wt 195 lb 4 oz (88.6 kg)   SpO2 95%   BMI 28.83 kg/m   General:   Well developed, well nourished . NAD.  Neck: No  thyromegaly  HEENT:  Normocephalic . Face symmetric, atraumatic Lungs:  CTA B Normal respiratory effort, no intercostal  retractions, no accessory muscle use. Heart: RRR,  no murmur.  No pretibial edema bilaterally  Abdomen:  Not distended, soft, non-tender. No rebound or rigidity.   Skin: Exposed areas without rash. Not pale. Not jaundice Neurologic:  alert & oriented X3.  Speech normal, gait appropriate for age and unassisted Strength symmetric and appropriate for age.  Psych: Cognition and judgment appear intact.  Cooperative with normal attention span and concentration.  Behavior appropriate. No anxious or depressed appearing.      Asssessment: Prediabetes, A1c 6.3 (12-2014) Smoker   PLAN  Prediabetes: diet- Exercise discussed. See instructions. A1C concept discussed  RTC 1 year

## 2015-12-23 NOTE — Progress Notes (Signed)
Pre visit review using our clinic review tool, if applicable. No additional management support is needed unless otherwise documented below in the visit note. 

## 2015-12-23 NOTE — Patient Instructions (Signed)
GO TO THE LAB : Get the blood work     GO TO THE FRONT DESK Schedule your next appointment for a   physical exam in one year   MIf you need more information about a healthy diet,  Pre- diabetes,  visit: The American Heart Association, http://www.heart.org  The American diabetes Association  Http://www.diabetes.org  [email or web address]

## 2015-12-25 DIAGNOSIS — Z09 Encounter for follow-up examination after completed treatment for conditions other than malignant neoplasm: Secondary | ICD-10-CM | POA: Insufficient documentation

## 2015-12-25 NOTE — Assessment & Plan Note (Signed)
Prediabetes: diet- Exercise discussed. See instructions. A1C concept discussed  RTC 1 year

## 2015-12-29 ENCOUNTER — Encounter: Payer: Self-pay | Admitting: Internal Medicine

## 2016-01-07 ENCOUNTER — Telehealth: Payer: Self-pay

## 2016-01-07 NOTE — Telephone Encounter (Signed)
[employer] [employer] of Social Services form received for Pt. He and wife, Santiago Glad, are trying to adopt. Form completed and copy sent for scanning, original form given to Santiago Glad.

## 2016-12-13 ENCOUNTER — Encounter: Payer: Self-pay | Admitting: Internal Medicine

## 2016-12-13 ENCOUNTER — Ambulatory Visit (INDEPENDENT_AMBULATORY_CARE_PROVIDER_SITE_OTHER): Payer: 59 | Admitting: Internal Medicine

## 2016-12-13 VITALS — BP 122/72 | HR 79 | Temp 97.9°F | Resp 14 | Ht 69.0 in | Wt 195.5 lb

## 2016-12-13 DIAGNOSIS — F17200 Nicotine dependence, unspecified, uncomplicated: Secondary | ICD-10-CM | POA: Diagnosis not present

## 2016-12-13 DIAGNOSIS — R7303 Prediabetes: Secondary | ICD-10-CM

## 2016-12-13 NOTE — Progress Notes (Signed)
   Subjective:    Patient ID: Kent Vargas, male    DOB: 06/08/1965, 51 y.o.   MRN: [id number]  DOS:  12/13/2016 Type of visit - description : acute Interval history: Desires to be a foster parent, needs paperwork completed.  He feels well.   Review of Systems Still smoking Denies fever chills No chest pain or difficulty breathing No nausea vomiting or diarrhea  Past Medical History:  Diagnosis Date  . Cough     Past Surgical History:  Procedure Laterality Date  . NO PAST SURGERIES      Social History   Socioeconomic History  . Marital status: Married    Spouse name: Kavish Lafitte  . Number of children: 1  . Years of education: Not on file  . Highest education level: Not on file  Social Needs  . Financial resource strain: Not on file  . Food insecurity - worry: Not on file  . Food insecurity - inability: Not on file  . Transportation needs - medical: Not on file  . Transportation needs - non-medical: Not on file  Occupational History  . Occupation: Nature conservation officer: [employer]  Tobacco Use  . Smoking status: Current Every Day Smoker  . Smokeless tobacco: Never Used  . Tobacco comment: 1 ppd   Substance and Sexual Activity  . Alcohol use: Yes    Alcohol/week: 0.0 oz    Comment: rare  . Drug use: Not on file  . Sexual activity: Not on file  Other Topics Concern  . Not on file  Social History Narrative          Allergies as of 12/13/2016   No Known Allergies     Medication List    as of 12/13/2016 11:59 PM   You have not been prescribed any medications.        Objective:   Physical Exam BP 122/72 (BP Location: Left Arm, Patient Position: Sitting, Cuff Size: Small)   Pulse 79   Temp 97.9 F (36.6 C) (Oral)   Resp 14   Ht 5\' 9"  (1.753 m)   Wt 195 lb 8 oz (88.7 kg)   SpO2 97%   BMI 28.87 kg/m  General:   Well developed, well nourished . NAD.  HEENT:  Normocephalic . Face symmetric, atraumatic Lungs:  CTA  B Normal respiratory effort, no intercostal retractions, no accessory muscle use. Heart: RRR,  no murmur.  No pretibial edema bilaterally  Skin: Not pale. Not jaundice Neurologic:  alert & oriented X3.  Speech normal, gait appropriate for age and unassisted Psych--  Cognition and judgment appear intact.  Cooperative with normal attention span and concentration.  Behavior appropriate. No anxious or depressed appearing.      Assessment & Plan:    Asssessment: Prediabetes, A1c 6.3 (12-2014) Smoker   PLAN  Prediabetes: Encouraged healthy diet, he is physically active at work Smoker: Still an issue, Conseled about Chantix.  Call if help is needed. Paperwork completed Declined  a flu shot RTC 3-4 months CPX

## 2016-12-13 NOTE — Progress Notes (Signed)
Pre visit review using our clinic review tool, if applicable. No additional management support is needed unless otherwise documented below in the visit note. 

## 2016-12-13 NOTE — Patient Instructions (Signed)
    GO TO THE FRONT DESK Schedule your next appointment for a physical exam within 2-3 months, fasting

## 2016-12-14 ENCOUNTER — Encounter: Payer: Self-pay | Admitting: Internal Medicine

## 2016-12-14 DIAGNOSIS — R7303 Prediabetes: Secondary | ICD-10-CM

## 2016-12-14 DIAGNOSIS — F17200 Nicotine dependence, unspecified, uncomplicated: Secondary | ICD-10-CM | POA: Insufficient documentation

## 2016-12-14 HISTORY — DX: Prediabetes: R73.03

## 2016-12-14 NOTE — Assessment & Plan Note (Signed)
Prediabetes: Encouraged healthy diet, he is physically active at work Smoker: Still an issue, Conseled about Chantix.  Call if help is needed. Paperwork completed Declined  a flu shot RTC 3-4 months CPX

## 2017-02-20 ENCOUNTER — Encounter: Payer: 59 | Admitting: Internal Medicine

## 2018-09-04 ENCOUNTER — Encounter: Payer: Self-pay | Admitting: Internal Medicine

## 2020-06-30 ENCOUNTER — Telehealth: Payer: Self-pay

## 2020-06-30 NOTE — Telephone Encounter (Signed)
Okay to schedule NP appt. Thank you.  

## 2020-06-30 NOTE — Telephone Encounter (Signed)
-----   Message from [facility] sent at 06/30/2020  2:03 PM EDT ----- Patient wants to [place] care. Please Advise

## 2020-06-30 NOTE — Telephone Encounter (Signed)
Please advise 

## 2020-06-30 NOTE — Telephone Encounter (Signed)
That is okay, thank you 

## 2020-07-01 NOTE — Telephone Encounter (Signed)
Called patient male stated it was the wrong number.
